# Patient Record
Sex: Female | Born: 1940 | Race: White | Hispanic: No | Marital: Married
Health system: Southern US, Community
[De-identification: ages and names within clinical notes are randomized; demographics above are authoritative.]

---

## 2020-07-24 ENCOUNTER — Emergency Department (HOSPITAL_COMMUNITY): Payer: Medicare Other

## 2020-07-24 ENCOUNTER — Other Ambulatory Visit: Payer: Self-pay

## 2020-07-24 ENCOUNTER — Emergency Department (HOSPITAL_COMMUNITY)
Admission: EM | Admit: 2020-07-24 | Discharge: 2020-07-24 | Disposition: A | Discharge status: Home / Self Care | Payer: Medicare Other | Attending: Emergency Medicine | Admitting: Emergency Medicine

## 2020-07-24 DIAGNOSIS — S0990XA Unspecified injury of head, initial encounter: Secondary | ICD-10-CM | POA: Diagnosis not present

## 2020-07-24 DIAGNOSIS — Y9384 Activity, sleeping: Secondary | ICD-10-CM | POA: Diagnosis not present

## 2020-07-24 DIAGNOSIS — S0511XA Contusion of eyeball and orbital tissues, right eye, initial encounter: Secondary | ICD-10-CM | POA: Diagnosis not present

## 2020-07-24 DIAGNOSIS — W19XXXA Unspecified fall, initial encounter: Secondary | ICD-10-CM

## 2020-07-24 DIAGNOSIS — S01511A Laceration without foreign body of lip, initial encounter: Secondary | ICD-10-CM

## 2020-07-24 DIAGNOSIS — Z7901 Long term (current) use of anticoagulants: Secondary | ICD-10-CM | POA: Insufficient documentation

## 2020-07-24 DIAGNOSIS — S51011A Laceration without foreign body of right elbow, initial encounter: Secondary | ICD-10-CM | POA: Insufficient documentation

## 2020-07-24 DIAGNOSIS — W06XXXA Fall from bed, initial encounter: Secondary | ICD-10-CM | POA: Diagnosis not present

## 2020-07-24 DIAGNOSIS — S59901A Unspecified injury of right elbow, initial encounter: Secondary | ICD-10-CM | POA: Diagnosis present

## 2020-07-24 NOTE — Progress Notes (Signed)
Orthopedic Tech Progress Note Patient Details:  Sherisse Fullilove Jan 21, 1941 [id number] Level 2 trauma  Patient ID: Kathaleen Grinder, female   DOB: 01-19-1941, 80 y.o.   MRN: [id number]   Maurene Capes 07/24/2020, 6:55 PM

## 2020-07-24 NOTE — ED Provider Notes (Signed)
[facility] EMERGENCY DEPARTMENT Provider Note   CSN: [id number] Arrival date & time: 07/24/20  1805     History Chief Complaint  Patient presents with  . Fall    Ana Ray is a 80 y.o. female.  HPI      80yo female with history of DM, chronic pain, on eliquis who presents with concern for fall out of bed.  Visiting from [facility] for graduation, was sleeping in hotel and woke up on the floor.  Reports UTD tetanus.  Skin tear to elbow, mild elbow pain, has chronic pain to back without acute changes.  Denies headache, nausea, vomiting, chest pain, abdominal pain, shortness of breath, n/v.  N  No past medical history on file.  There are no problems to display for this patient.     OB History   No obstetric history on file.     No family history on file.     Home Medications Prior to Admission medications   Not on File    Allergies    Patient has no allergy information on record.  Review of Systems   Review of Systems  Constitutional: Negative for fever.  HENT: Negative for sore throat.   Eyes: Negative for visual disturbance.  Respiratory: Negative for cough and shortness of breath.   Cardiovascular: Negative for chest pain.  Gastrointestinal: Negative for abdominal pain, nausea and vomiting.  Genitourinary: Negative for difficulty urinating.  Musculoskeletal: Positive for back pain (chronic unchanged). Negative for neck pain.  Skin: Positive for wound. Negative for rash.  Neurological: Negative for syncope and headaches.    Physical Exam Updated Vital Signs BP (!) 183/81   Pulse 81   Temp 98 F (36.7 C) (Oral)   Resp 19   Ht 5\' 4"  (1.626 m)   Wt 108.9 kg   SpO2 95%   BMI 41.20 kg/m   Physical Exam Vitals and nursing note reviewed.  Constitutional:      General: She is not in acute distress.    Appearance: She is well-developed. She is not diaphoretic.  HENT:     Head: Normocephalic.     Comments: 49mm laceration on  vermillion border Laceration inner upper lip Eyes:     Conjunctiva/sclera: Conjunctivae normal.  Cardiovascular:     Rate and Rhythm: Normal rate and regular rhythm.     Heart sounds: Normal heart sounds. No murmur heard. No friction rub. No gallop.   Pulmonary:     Effort: Pulmonary effort is normal. No respiratory distress.     Breath sounds: Normal breath sounds. No wheezing or rales.  Abdominal:     General: There is no distension.     Palpations: Abdomen is soft.     Tenderness: There is no abdominal tenderness. There is no guarding.  Musculoskeletal:        General: No tenderness.     Cervical back: Normal range of motion.  Skin:    General: Skin is warm and dry.     Findings: No erythema or rash.     Comments: Skin tear right elbow  Neurological:     Mental Status: She is alert and oriented to person, place, and time.     ED Results / Procedures / Treatments   Labs (all labs ordered are listed, but only abnormal results are displayed) Labs Reviewed - No data to display  EKG None  Radiology DG Elbow Complete Right  Result Date: 07/24/2020 CLINICAL DATA:  Status post fall. EXAM: RIGHT ELBOW -  COMPLETE 3+ VIEW COMPARISON:  None. FINDINGS: There is no evidence of fracture, dislocation, or joint effusion. There is no evidence of arthropathy or other focal bone abnormality. Soft tissues are unremarkable. IMPRESSION: Negative. Electronically Signed   By: Aram Candela M.D.   On: 07/24/2020 19:13   CT Head Wo Contrast  Result Date: 07/24/2020 CLINICAL DATA:  Fall. Blunt head trauma. On anticoagulation. Initial encounter. EXAM: CT HEAD WITHOUT CONTRAST TECHNIQUE: Contiguous axial images were obtained from the base of the skull through the vertex without intravenous contrast. COMPARISON:  None. FINDINGS: Brain: No evidence of acute infarction, hemorrhage, hydrocephalus, extra-axial collection, or mass lesion/mass effect. Encephalomalacia is seen in the right frontal lobe,  most likely due to old infarct. Moderate diffuse cerebral atrophy and mild chronic small vessel disease is noted. Vascular:  No hyperdense vessel or other acute findings. Skull: No evidence of fracture or other significant bone abnormality. Sinuses/Orbits:  No acute findings. Other: None. IMPRESSION: No acute intracranial abnormality. Moderate cerebral atrophy and mild chronic small vessel disease. Old right frontal lobe infarct. Electronically Signed   By: Danae Orleans M.D.   On: 07/24/2020 18:39   CT Cervical Spine Wo Contrast  Result Date: 07/24/2020 CLINICAL DATA:  Fall.  Neck trauma.  Initial encounter. EXAM: CT CERVICAL SPINE WITHOUT CONTRAST TECHNIQUE: Multidetector CT imaging of the cervical spine was performed without intravenous contrast. Multiplanar CT image reconstructions were also generated. COMPARISON:  None. FINDINGS: Alignment: Normal. Skull base and vertebrae: No acute fracture. No primary bone lesion or focal pathologic process. Soft tissues and spinal canal: No prevertebral fluid or swelling. No visible canal hematoma. Disc levels: Moderate degenerative disc disease is seen at levels of C5-6 and C6-7. Upper chest: No acute findings. Other: None. IMPRESSION: No evidence of cervical spine fracture or subluxation. Degenerative disc disease at C5-6 and C6-7. Electronically Signed   By: Danae Orleans M.D.   On: 07/24/2020 18:43   DG Hip Unilat W or Wo Pelvis 2-3 Views Left  Result Date: 07/24/2020 CLINICAL DATA:  Left hip pain.  Fall out of bed. EXAM: DG HIP (WITH OR WITHOUT PELVIS) 2-3V LEFT COMPARISON:  None. FINDINGS: The cortical margins of the bony pelvis and left hip are intact. No visualized fracture. Pubic symphysis and sacroiliac joints are congruent. Pubic rami are grossly intact. Presumed stimulator projects over the right pelvis. Postsurgical change in the lumbosacral region. Questionable hardware in the lateral left femur is only partially included in the frogleg lateral view. Soft  tissue attenuation from habitus limits detailed assessment. IMPRESSION: No evidence of pelvic or left hip fracture. Electronically Signed   By: Narda Rutherford M.D.   On: 07/24/2020 19:08   CT Maxillofacial WO CM  Result Date: 07/24/2020 CLINICAL DATA:  Fall.  Facial trauma.  Initial encounter. EXAM: CT MAXILLOFACIAL WITHOUT CONTRAST TECHNIQUE: Multidetector CT imaging of the maxillofacial structures was performed. Multiplanar CT image reconstructions were also generated. COMPARISON:  None. FINDINGS: Osseous: No acute fracture or other significant osseous abnormality. Orbits: No fracture identified. Unremarkable appearance of globes and intraorbital anatomy. Sinuses: No air-fluid levels or other acute findings. Mucous retention cyst incidentally noted in the posterior right maxillary sinus. Soft tissues:  No acute findings. IMPRESSION: No acute findings.  No evidence of orbital or facial bone fracture. Electronically Signed   By: Danae Orleans M.D.   On: 07/24/2020 18:46    Procedures Procedures   Medications Ordered in ED Medications - No data to display  ED Course  I have reviewed the  triage vital signs and the nursing notes.  Pertinent labs & imaging results that were available during my care of the patient were reviewed by me and considered in my medical decision making (see chart for details).    MDM Rules/Calculators/A&P                          80yo female with history of DM, chronic pain, on eliquis who presents with concern for fall out of bed, Level 2 trauma called in triage given fall on anticoagulation.    CT head, CSpine, Face without signs of acute abnormalities.  XR elbow and hip without sign of fracture. No other areas of acute pain or injury. No medical concerns.  Tiny laceration to vermillion border very small and feel like risk of scarring from repair is greater than risk of scarring from injury.  UTD on tetanus vaccine.  Patient discharged in stable condition with  understanding of reasons to return.    Final Clinical Impression(s) / ED Diagnoses Final diagnoses:  Fall, initial encounter  Lip laceration, initial encounter  Skin tear of right elbow without complication, initial encounter    Rx / DC Orders ED Discharge Orders    None       Alvira Monday, MD 07/25/20 1135

## 2020-07-24 NOTE — ED Provider Notes (Addendum)
Emergency Medicine Provider Triage Evaluation Note  Ana Ray , a 80 y.o. female  was evaluated in triage.  Pt complains of fall, states that she rolled out of bed and hit her head on the bedside table, has a laceration inside her mouth as well as a skin tear to her right arm, no loss of consciousness, denies neck or back pain.  Patient is on Eliquis. Occurred 2 hours prior to arrival. Review of Systems  Positive: Mouth laceration, skin tear arm Negative: Loss of consciousness  Physical Exam  There were no vitals taken for this visit. Gen:   Awake, no distress   Resp:  Normal effort  MSK:   Moves extremities without difficulty  Other:  Speech clear  Medical Decision Making  Medically screening exam initiated at 6:07 PM.  Appropriate orders placed.  Ana Ray was informed that the remainder of the evaluation will be completed by another provider, this initial triage assessment does not replace that evaluation, and the importance of remaining in the ED until their evaluation is complete.     Jeannie Fend, PA-C 07/24/20 1810    Jeannie Fend, PA-C 07/24/20 1811    Benjiman Core, MD 07/24/20 [phone]

## 2020-07-24 NOTE — ED Triage Notes (Signed)
Pt bib family reports felling out of bed at 1645. Pt is on blood thinner. No LOC.  A& O X 4

## 2021-10-16 IMAGING — CT CT HEAD W/O CM
4 series · 16 of 47 positions shown, 18 images · non-contrast
Comparison: None.

CLINICAL DATA: Fall. Blunt head trauma. On anticoagulation. Initial
encounter.

EXAM:
CT HEAD WITHOUT CONTRAST
TECHNIQUE: Contiguous axial images were obtained from the base of the skull
through the vertex without intravenous contrast.

[Series 2: head without · axial · non-contrast · 0.43mm/px · z∈[-155,-35]mm · 7 of 34 slices shown, 9 images]
[im 5/34  brain]
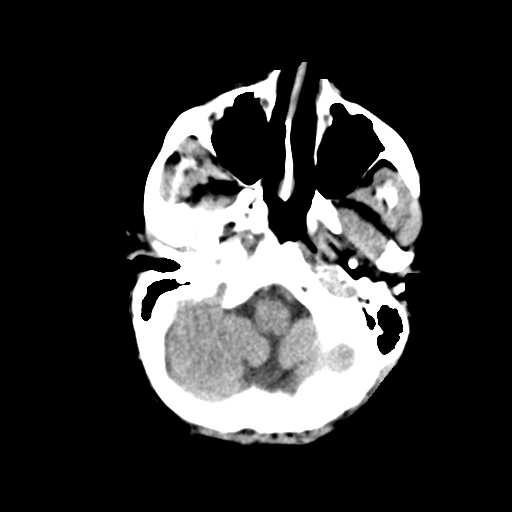
[im 5/34  bone]
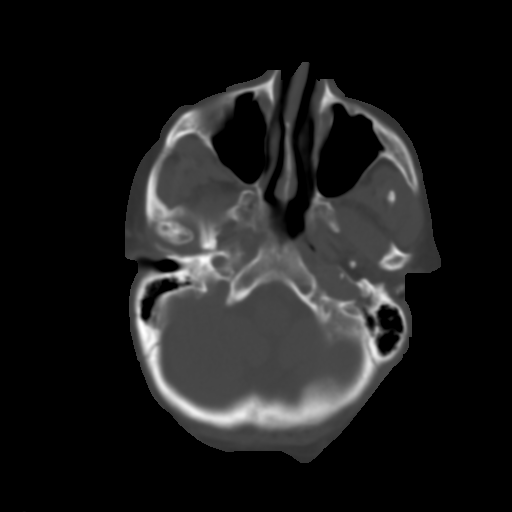
[im 9/34  brain]
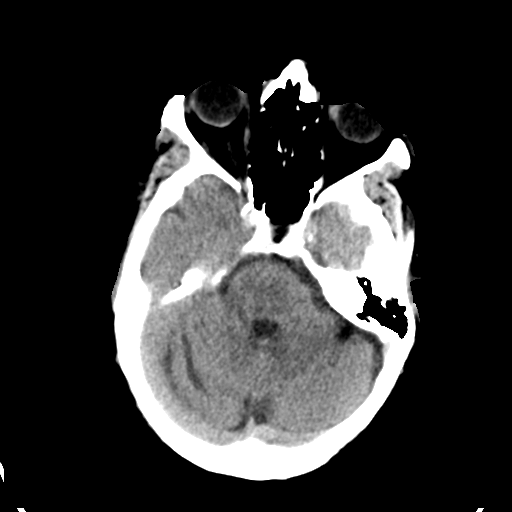
[im 13/34  brain]
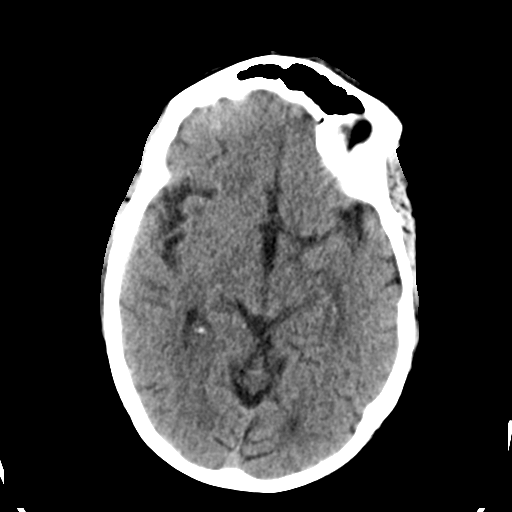
[im 17/34  brain]
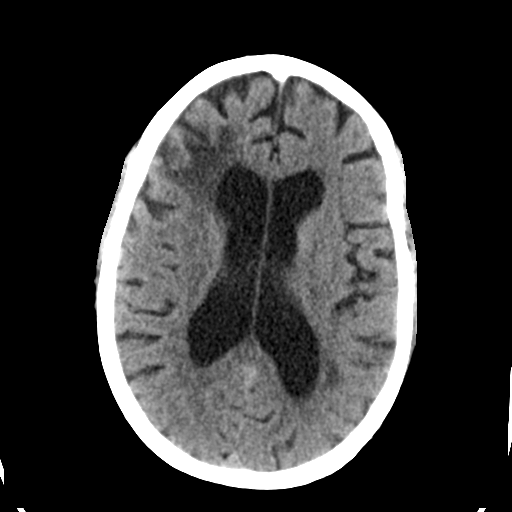
[im 21/34  brain]
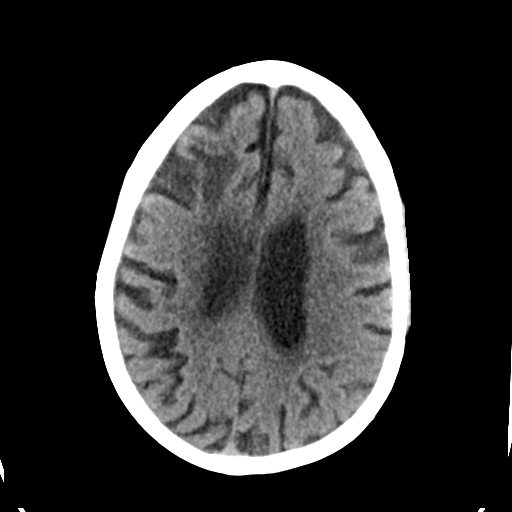
[im 21/34  bone]
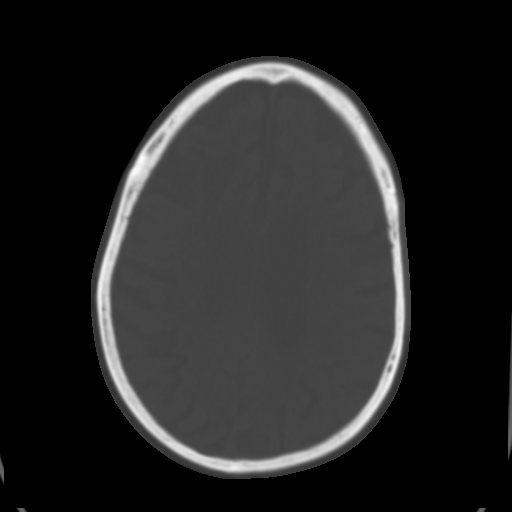
[im 25/34  brain]
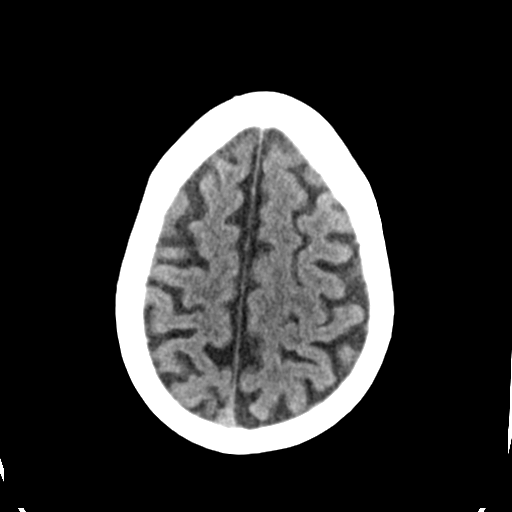
[im 29/34  brain]
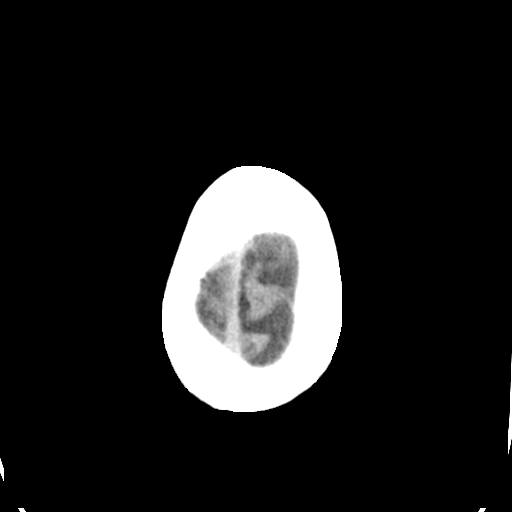

[Series 3: head bone · axial · 0.43mm/px · z∈[-159,-125]mm · 3 of 85 slices shown]
[im 9/85  bone]
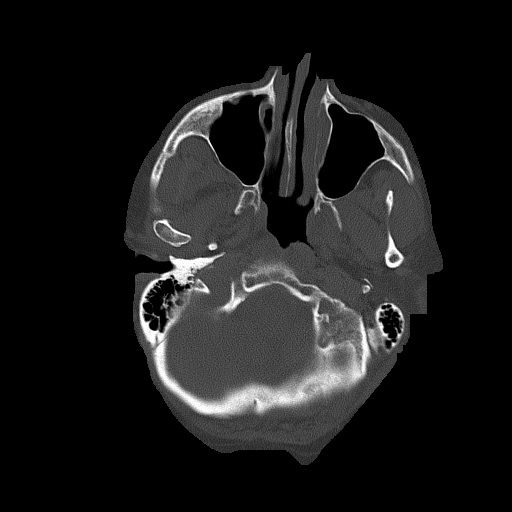
[im 17/85  bone]
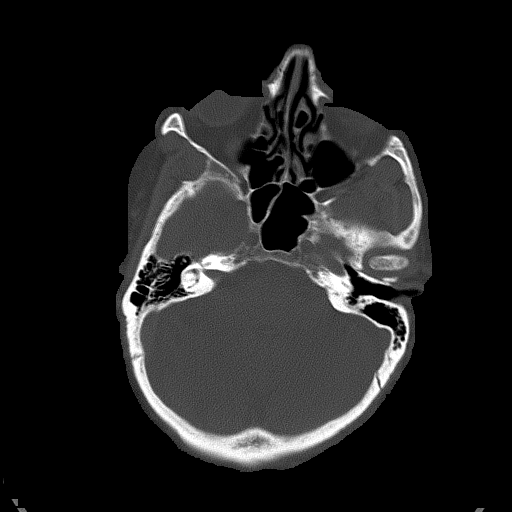
[im 26/85  bone]
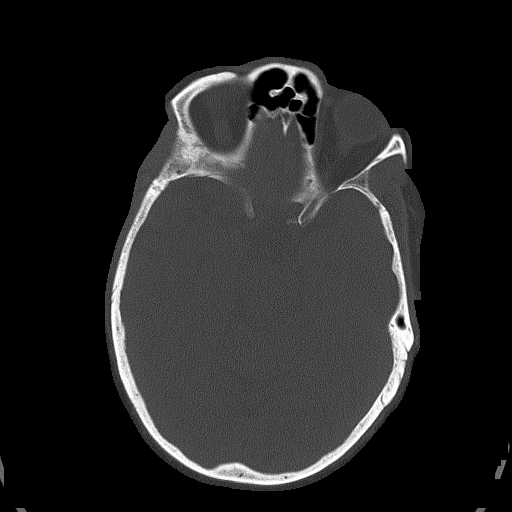

[Series 4: head without cor · coronal · non-contrast · 0.29mm/px · 3 of 67 slices shown]
[im 23/67  brain]
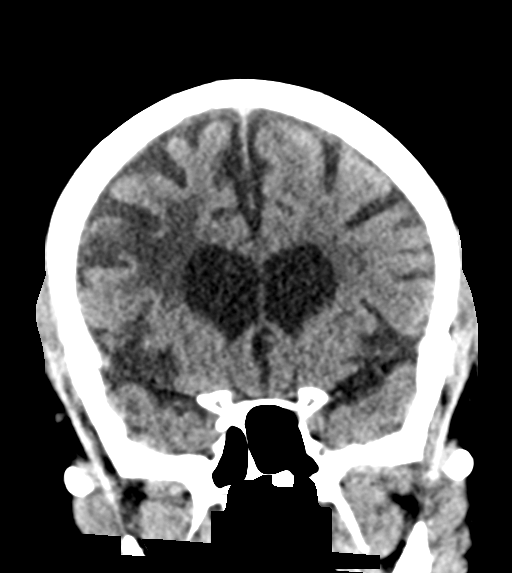
[im 30/67  brain]
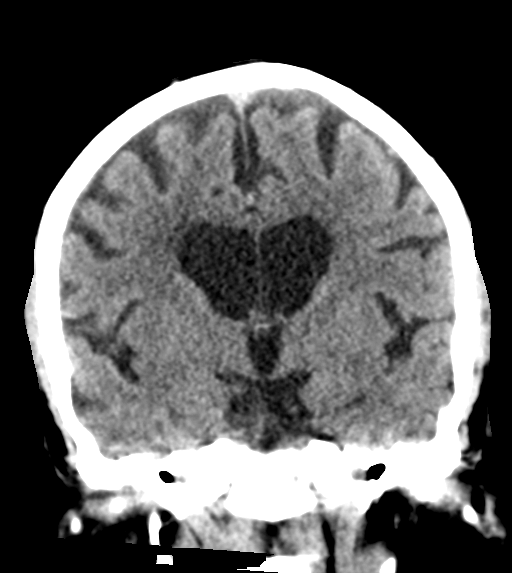
[im 37/67  brain]
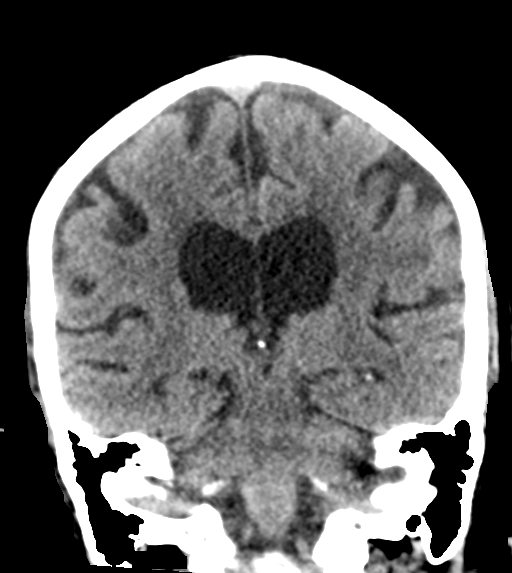

[Series 5: head without sag · sagittal · non-contrast · 0.33mm/px · 3 of 67 slices shown]
[im 24/67  brain]
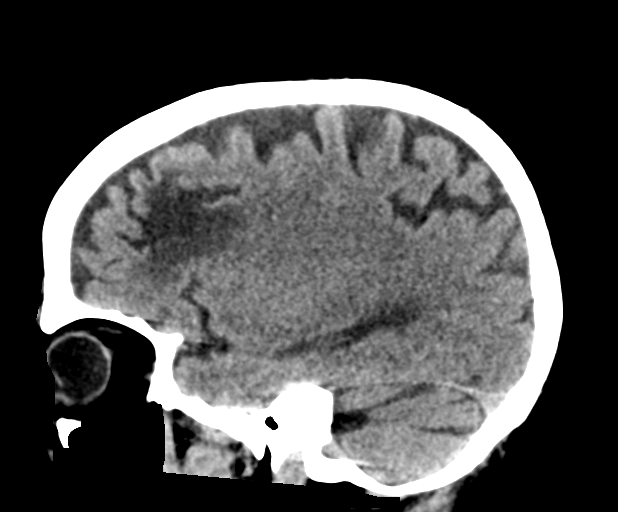
[im 34/67  brain]
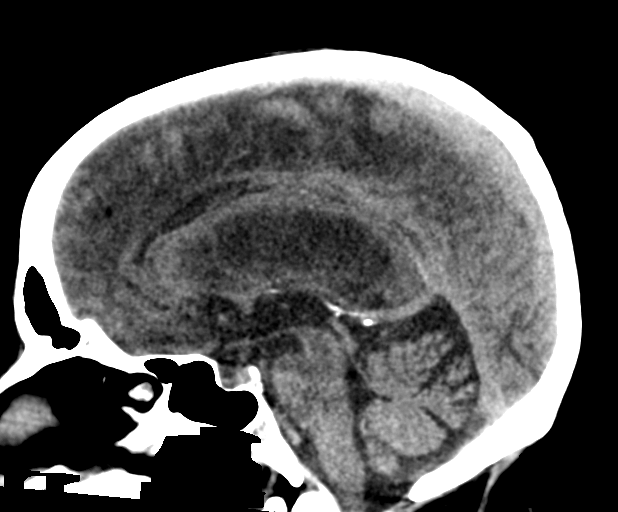
[im 43/67  brain]
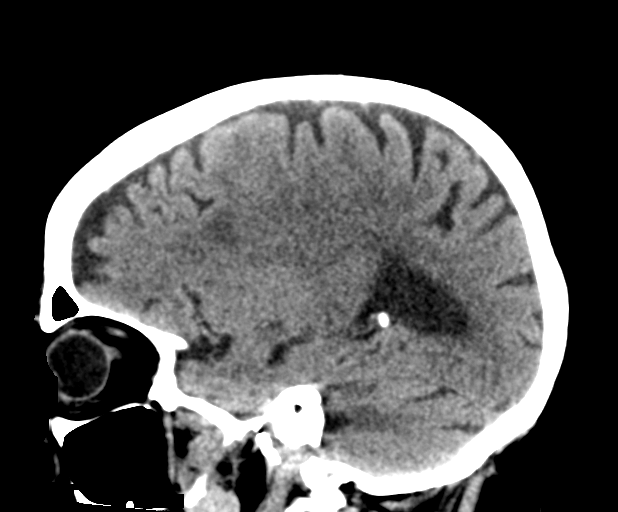

[16 of 47 positions shown; findings below may reference images not displayed]

FINDINGS: Brain: No evidence of acute infarction, hemorrhage, hydrocephalus,
extra-axial collection, or mass lesion/mass effect. Encephalomalacia
is seen in the right frontal lobe, most likely due to old infarct.
Moderate diffuse cerebral atrophy and mild chronic small vessel
disease is noted.

Vascular:  No hyperdense vessel or other acute findings.

Skull: No evidence of fracture or other significant bone
abnormality.

Sinuses/Orbits:  No acute findings.

Other: None.
IMPRESSION: No acute intracranial abnormality.

Moderate cerebral atrophy and mild chronic small vessel disease.

Old right frontal lobe infarct.

## 2021-10-16 IMAGING — CT CT MAXILLOFACIAL W/O CM
3 series · 16 of 47 positions shown, 19 images · non-contrast
Comparison: None.

CLINICAL DATA: Fall.  Facial trauma.  Initial encounter.

EXAM:
CT MAXILLOFACIAL WITHOUT CONTRAST
TECHNIQUE: Multidetector CT imaging of the maxillofacial structures was
performed. Multiplanar CT image reconstructions were also generated.

[Series 3: facialbone 2.0 st · axial · 0.31mm/px · z∈[-252,-110]mm · 10 of 83 slices shown, 13 images]
[im 6/83  brain]
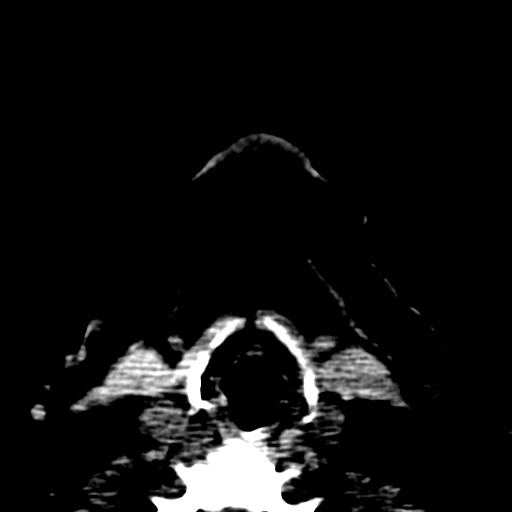
[im 6/83  bone]
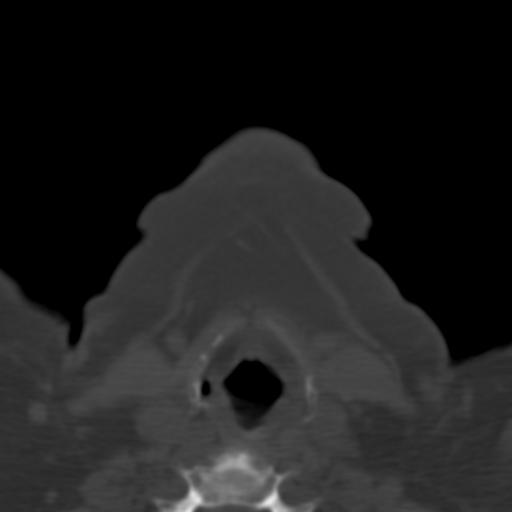
[im 15/83  bone]
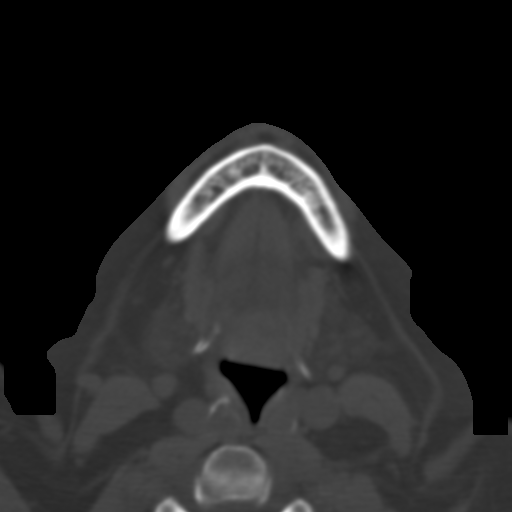
[im 23/83  bone]
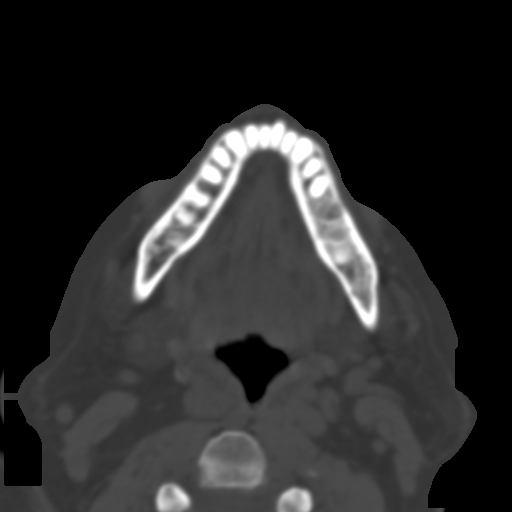
[im 29/83  bone]
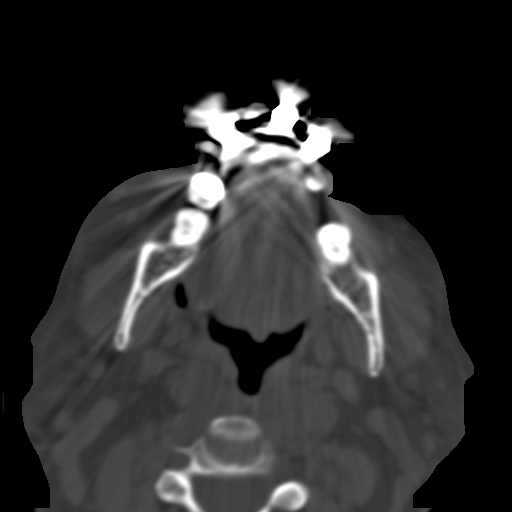
[im 37/83  brain]
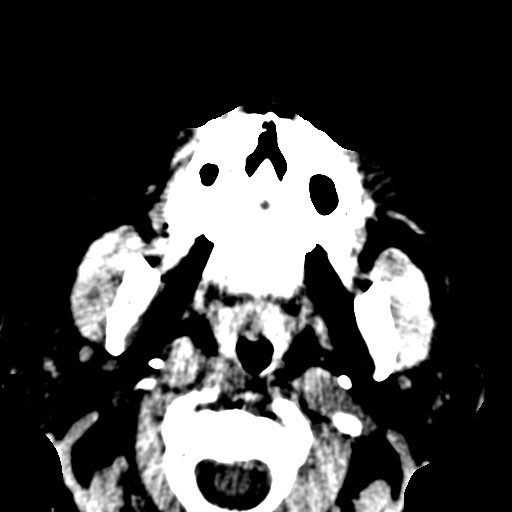
[im 37/83  bone]
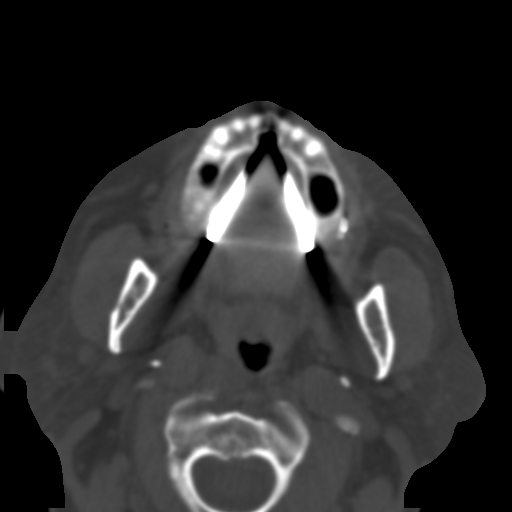
[im 46/83  bone]
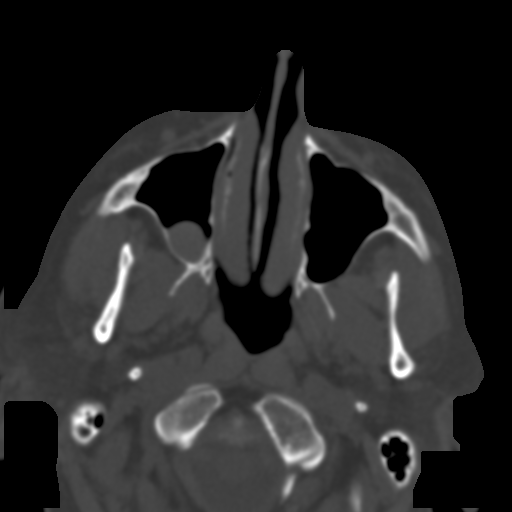
[im 54/83  bone]
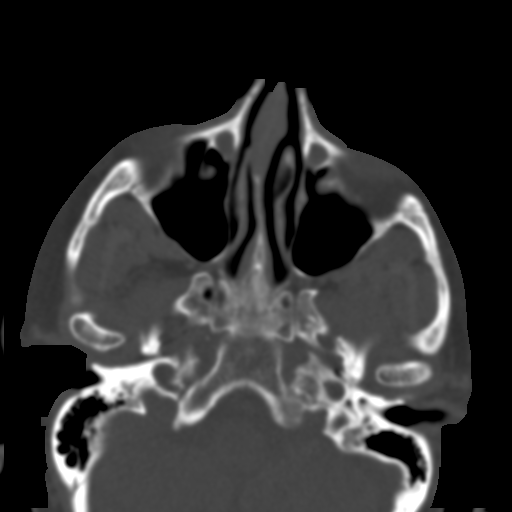
[im 63/83  bone]
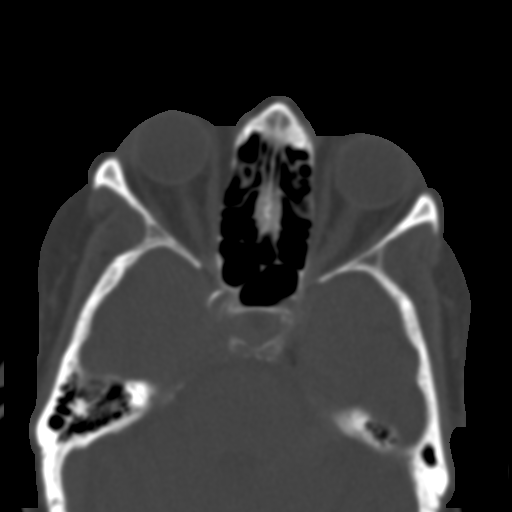
[im 68/83  brain]
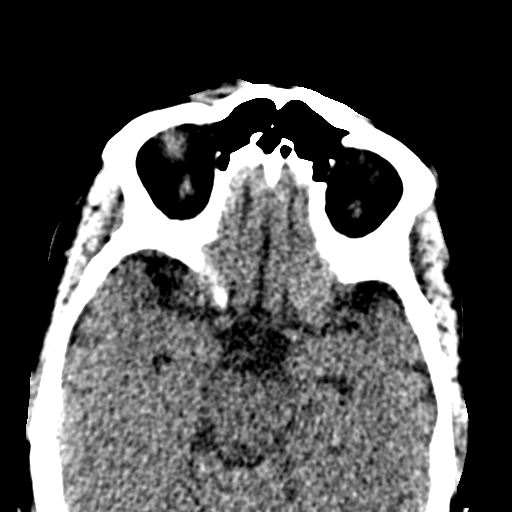
[im 68/83  bone]
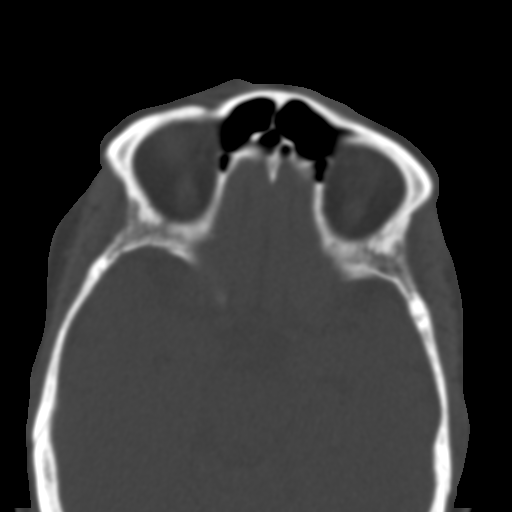
[im 77/83  bone]
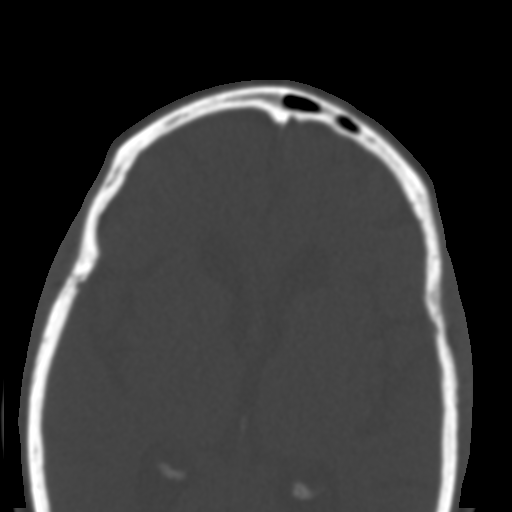

[Series 7: facialbone 2.0 cor st · coronal · 0.29mm/px · 3 of 76 slices shown]
[im 26/76  bone]
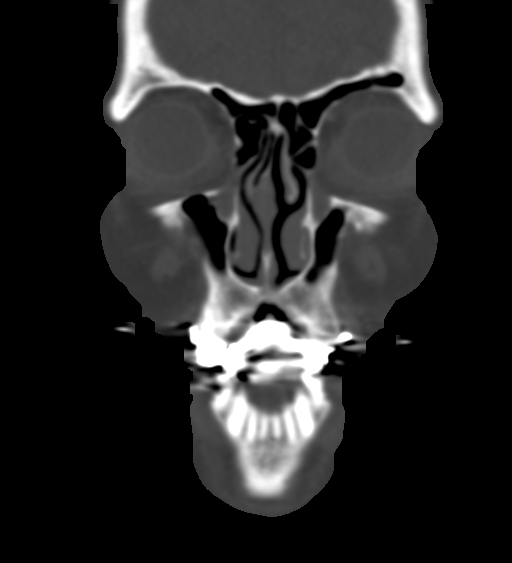
[im 34/76  bone]
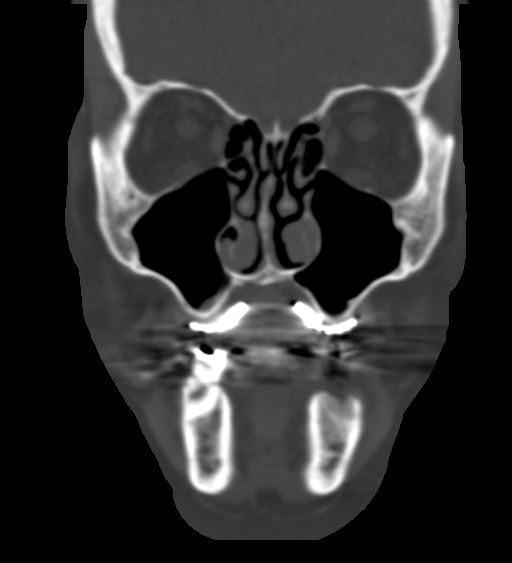
[im 42/76  bone]
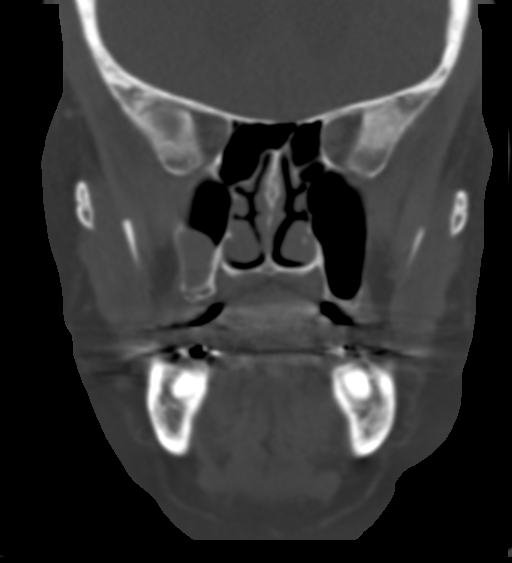

[Series 8: facialbone 2.0 sag st · sagittal · 0.27mm/px · 3 of 76 slices shown]
[im 26/76  bone]
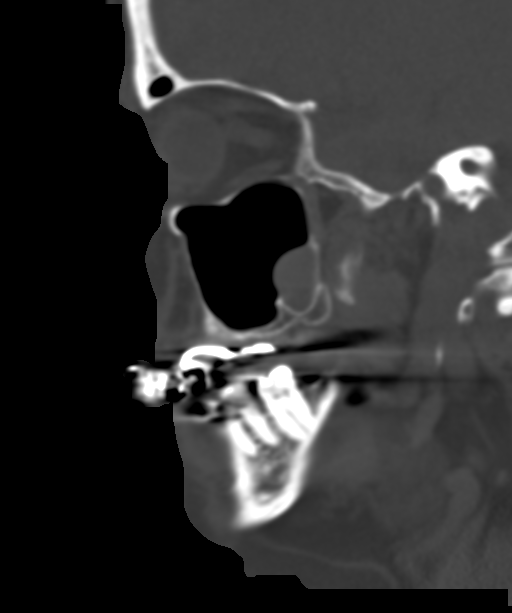
[im 38/76  bone]
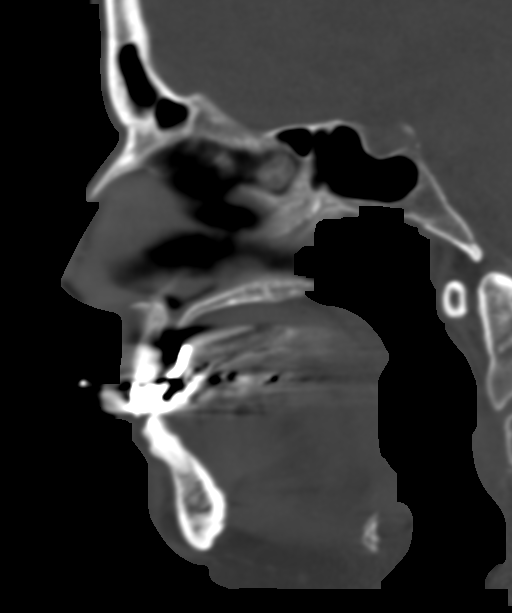
[im 51/76  bone]
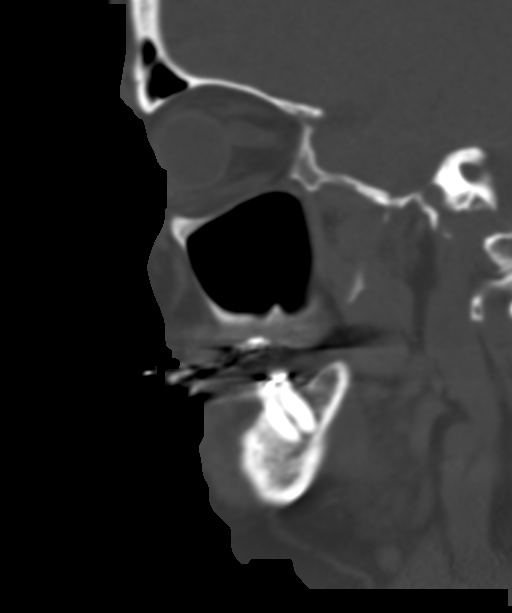

[16 of 47 positions shown; findings below may reference images not displayed]

FINDINGS: Osseous: No acute fracture or other significant osseous abnormality.

Orbits: No fracture identified. Unremarkable appearance of globes
and intraorbital anatomy.

Sinuses: No air-fluid levels or other acute findings. Mucous
retention cyst incidentally noted in the posterior right maxillary
sinus.

Soft tissues:  No acute findings.
IMPRESSION: No acute findings.  No evidence of orbital or facial bone fracture.
